# Patient Record
Sex: Male | Born: 1990 | Race: Black or African American | Hispanic: No | Marital: Single | Smoking: Current every day smoker
Health system: Southern US, Community
[De-identification: ages and names within clinical notes are randomized; demographics above are authoritative.]

---
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
## 2012-07-02 ENCOUNTER — Emergency Department (HOSPITAL_BASED_OUTPATIENT_CLINIC_OR_DEPARTMENT_OTHER)
Admission: EM | Admit: 2012-07-02 | Discharge: 2012-07-03 | Disposition: A | Discharge status: Home / Self Care | Payer: Self-pay | Attending: Emergency Medicine | Admitting: Emergency Medicine

## 2012-07-02 ENCOUNTER — Encounter (HOSPITAL_BASED_OUTPATIENT_CLINIC_OR_DEPARTMENT_OTHER): Payer: Self-pay | Admitting: *Deleted

## 2012-07-02 DIAGNOSIS — R51 Headache: Secondary | ICD-10-CM | POA: Insufficient documentation

## 2012-07-02 DIAGNOSIS — F17200 Nicotine dependence, unspecified, uncomplicated: Secondary | ICD-10-CM | POA: Insufficient documentation

## 2012-07-02 DIAGNOSIS — B349 Viral infection, unspecified: Secondary | ICD-10-CM

## 2012-07-02 DIAGNOSIS — R059 Cough, unspecified: Secondary | ICD-10-CM | POA: Insufficient documentation

## 2012-07-02 DIAGNOSIS — R197 Diarrhea, unspecified: Secondary | ICD-10-CM | POA: Insufficient documentation

## 2012-07-02 DIAGNOSIS — R1110 Vomiting, unspecified: Secondary | ICD-10-CM | POA: Insufficient documentation

## 2012-07-02 DIAGNOSIS — R6883 Chills (without fever): Secondary | ICD-10-CM | POA: Insufficient documentation

## 2012-07-02 DIAGNOSIS — R05 Cough: Secondary | ICD-10-CM | POA: Insufficient documentation

## 2012-07-02 DIAGNOSIS — B9789 Other viral agents as the cause of diseases classified elsewhere: Secondary | ICD-10-CM | POA: Insufficient documentation

## 2012-07-02 LAB — URINALYSIS, ROUTINE W REFLEX MICROSCOPIC
Glucose, UA: NEGATIVE mg/dL
Ketones, ur: 15 mg/dL — AB
Leukocytes, UA: NEGATIVE
Nitrite: NEGATIVE
Specific Gravity, Urine: 1.042 — ABNORMAL HIGH (ref 1.005–1.030)
pH: 6 (ref 5.0–8.0)

## 2012-07-02 LAB — URINE MICROSCOPIC-ADD ON

## 2012-07-02 LAB — RAPID STREP SCREEN (MED CTR MEBANE ONLY): Streptococcus, Group A Screen (Direct): NEGATIVE

## 2012-07-02 NOTE — ED Notes (Signed)
Headache, vomiting, cough, chills, and sore throat since yesterday.

## 2012-07-03 MED ORDER — DEXAMETHASONE SODIUM PHOSPHATE 10 MG/ML IJ SOLN
10.0000 mg | Freq: Once | INTRAMUSCULAR | Status: AC
  Administered 2012-07-03: 10 mg via INTRAVENOUS
  Filled 2012-07-03: qty 1

## 2012-07-03 MED ORDER — ONDANSETRON HCL 8 MG PO TABS: 8.0000 mg | ORAL_TABLET | Freq: Three times a day (TID) | ORAL | Status: DC | PRN

## 2012-07-03 MED ORDER — SODIUM CHLORIDE 0.9 % IV BOLUS (SEPSIS)
1000.0000 mL | Freq: Once | INTRAVENOUS | Status: AC
  Administered 2012-07-03: 1000 mL via INTRAVENOUS

## 2012-07-03 MED ORDER — HYDROCOD POLST-CHLORPHEN POLST 10-8 MG/5ML PO LQCR: 5.0000 mL | Freq: Two times a day (BID) | ORAL | Status: DC | PRN

## 2012-07-03 MED ORDER — ONDANSETRON HCL 4 MG/2ML IJ SOLN
4.0000 mg | Freq: Once | INTRAMUSCULAR | Status: AC
  Administered 2012-07-03: 4 mg via INTRAVENOUS
  Filled 2012-07-03: qty 2

## 2012-07-03 NOTE — ED Provider Notes (Signed)
History  This chart was scribed for Ronald Seamen, MD by Shari Heritage, ED Scribe. The patient was seen in room MH01/MH01. Patient's care was started at 0012.   CSN: [id number]  Arrival date & time 07/02/12  2323   First MD Initiated Contact with Patient 07/03/12 0012      Chief Complaint  Patient presents with  . Sore Throat      The history is provided by the patient. No language interpreter was used.     HPI Comments: Ronald Sanders is a 22 y.o. male who presents to the Emergency Department constant, moderate to severe sore throat pain onset 1.5 days ago. Patient is also complaining of a moderate to severe, constant, right-sided headache. There is associated 1 episode of vomiting, diarrhea, chills and dry mouth. Patient denies cough. He reports no other symptoms or complaints at this time. Patient reports no significant past medical history.  History reviewed. No pertinent past medical history.  History reviewed. No pertinent past surgical history.  No family history on file.  History  Substance Use Topics  . Smoking status: Current Every Day Smoker -- 0.5 packs/day    Types: Cigarettes  . Smokeless tobacco: Not on file  . Alcohol Use: No      Review of Systems A complete 10 system review of systems was obtained and all systems are negative except as noted in the HPI and PMH.   Allergies  Review of patient's allergies indicates no known allergies.  Home Medications  No current outpatient prescriptions on file.  BP 139/77  Pulse 96  Temp 98.5 F (36.9 C) (Oral)  Resp 20  Ht 5\' 9"  (1.753 m)  Wt 170 lb (77.111 kg)  BMI 25.10 kg/m2  SpO2 99%  Physical Exam  Constitutional: He is oriented to person, place, and time. He appears well-developed and well-nourished. No distress.  HENT:  Head: Normocephalic and atraumatic.  Mouth/Throat: Mucous membranes are normal. Posterior oropharyngeal edema and posterior oropharyngeal erythema present. No oropharyngeal  exudate.       Oropharyngeal erythema with mild edema. No exudate.  Midl anterior cervical adenopathy.   Eyes: Conjunctivae normal are normal. Pupils are equal, round, and reactive to light.  Neck: Normal range of motion. Neck supple.       Mild anterior cervical adenopathy.  Cardiovascular: Normal rate, regular rhythm and normal heart sounds.   Pulmonary/Chest: Effort normal and breath sounds normal. No respiratory distress. He has no wheezes. He has no rales.  Musculoskeletal: Normal range of motion.  Lymphadenopathy:    He has cervical adenopathy.  Neurological: He is alert and oriented to person, place, and time.  Skin: Skin is warm and dry. No rash noted.  Psychiatric: He has a normal mood and affect. His behavior is normal.    ED Course  Procedures (including critical care time) DIAGNOSTIC STUDIES: Oxygen Saturation is 99% on room air, normal by my interpretation.    COORDINATION OF CARE: 12:18 AM- Patient informed of current plan for treatment and evaluation and agrees with plan at this time.     MDM   Nursing notes and vitals signs, including pulse oximetry, reviewed.  Summary of this visit's results, reviewed by myself:  Labs:  Results for orders placed during the hospital encounter of 07/02/12 (from the past 24 hour(s))  RAPID STREP SCREEN     Status: Normal   Collection Time   07/02/12 11:29 PM      Component Value Range   Streptococcus, Group A  Screen (Direct) NEGATIVE  NEGATIVE  URINALYSIS, ROUTINE W REFLEX MICROSCOPIC     Status: Abnormal   Collection Time   07/02/12 11:30 PM      Component Value Range   Color, Urine AMBER (*) YELLOW   APPearance CLEAR  CLEAR   Specific Gravity, Urine 1.042 (*) 1.005 - 1.030   pH 6.0  5.0 - 8.0   Glucose, UA NEGATIVE  NEGATIVE mg/dL   Hgb urine dipstick NEGATIVE  NEGATIVE   Bilirubin Urine SMALL (*) NEGATIVE   Ketones, ur 15 (*) NEGATIVE mg/dL   Protein, ur 30 (*) NEGATIVE mg/dL   Urobilinogen, UA 1.0  0.0 - 1.0 mg/dL    Nitrite NEGATIVE  NEGATIVE   Leukocytes, UA NEGATIVE  NEGATIVE  URINE MICROSCOPIC-ADD ON     Status: Normal   Collection Time   07/02/12 11:30 PM      Component Value Range   Squamous Epithelial / LPF RARE  RARE   WBC, UA 0-2  <3 WBC/hpf   RBC / HPF 0-2  <3 RBC/hpf   Bacteria, UA RARE  RARE   Urine-Other MUCOUS PRESENT     1:22 AM Patient taking fluids without emesis after IV fluid bolus and Zofran IV.      I personally performed the services described in this documentation, which was scribed in my presence.  The recorded information has been reviewed and is accurate.Carlisle Beers Marcia Hartwell, MD 07/03/12 [phone]

## 2013-12-18 ENCOUNTER — Emergency Department (HOSPITAL_COMMUNITY)
Admission: EM | Admit: 2013-12-18 | Discharge: 2013-12-19 | Disposition: A | Discharge status: Home / Self Care | Payer: Self-pay | Attending: Emergency Medicine | Admitting: Emergency Medicine

## 2013-12-18 ENCOUNTER — Encounter (HOSPITAL_COMMUNITY): Payer: Self-pay | Admitting: Emergency Medicine

## 2013-12-18 ENCOUNTER — Emergency Department (HOSPITAL_COMMUNITY): Payer: Self-pay

## 2013-12-18 DIAGNOSIS — T1490XA Injury, unspecified, initial encounter: Secondary | ICD-10-CM

## 2013-12-18 DIAGNOSIS — S99919A Unspecified injury of unspecified ankle, initial encounter: Secondary | ICD-10-CM

## 2013-12-18 DIAGNOSIS — Y92410 Unspecified street and highway as the place of occurrence of the external cause: Secondary | ICD-10-CM | POA: Insufficient documentation

## 2013-12-18 DIAGNOSIS — S6990XA Unspecified injury of unspecified wrist, hand and finger(s), initial encounter: Secondary | ICD-10-CM | POA: Insufficient documentation

## 2013-12-18 DIAGNOSIS — S46909A Unspecified injury of unspecified muscle, fascia and tendon at shoulder and upper arm level, unspecified arm, initial encounter: Secondary | ICD-10-CM | POA: Insufficient documentation

## 2013-12-18 DIAGNOSIS — S8990XA Unspecified injury of unspecified lower leg, initial encounter: Secondary | ICD-10-CM | POA: Insufficient documentation

## 2013-12-18 DIAGNOSIS — S99929A Unspecified injury of unspecified foot, initial encounter: Secondary | ICD-10-CM

## 2013-12-18 DIAGNOSIS — Y9389 Activity, other specified: Secondary | ICD-10-CM | POA: Insufficient documentation

## 2013-12-18 DIAGNOSIS — F17200 Nicotine dependence, unspecified, uncomplicated: Secondary | ICD-10-CM | POA: Insufficient documentation

## 2013-12-18 DIAGNOSIS — IMO0002 Reserved for concepts with insufficient information to code with codable children: Secondary | ICD-10-CM | POA: Insufficient documentation

## 2013-12-18 DIAGNOSIS — S4980XA Other specified injuries of shoulder and upper arm, unspecified arm, initial encounter: Secondary | ICD-10-CM | POA: Insufficient documentation

## 2013-12-18 MED ORDER — HYDROCODONE-ACETAMINOPHEN 5-325 MG PO TABS
1.0000 | ORAL_TABLET | Freq: Once | ORAL | Status: AC
  Administered 2013-12-18: 1 via ORAL
  Filled 2013-12-18: qty 1

## 2013-12-18 NOTE — ED Notes (Signed)
Patient transported to X-ray 
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
## 2021-07-16 ENCOUNTER — Emergency Department (HOSPITAL_COMMUNITY)
Admission: EM | Admit: 2021-07-16 | Discharge: 2021-07-16 | Disposition: A | Discharge status: Home / Self Care | Payer: Self-pay | Attending: Emergency Medicine | Admitting: Emergency Medicine

## 2021-07-16 ENCOUNTER — Encounter (HOSPITAL_COMMUNITY): Payer: Self-pay | Admitting: *Deleted

## 2021-07-16 DIAGNOSIS — H70012 Subperiosteal abscess of mastoid, left ear: Secondary | ICD-10-CM | POA: Diagnosis not present

## 2021-07-16 DIAGNOSIS — H938X2 Other specified disorders of left ear: Secondary | ICD-10-CM | POA: Diagnosis present

## 2021-07-16 DIAGNOSIS — Z79899 Other long term (current) drug therapy: Secondary | ICD-10-CM | POA: Insufficient documentation

## 2021-07-16 DIAGNOSIS — L0291 Cutaneous abscess, unspecified: Secondary | ICD-10-CM

## 2021-07-16 MED ORDER — IBUPROFEN 800 MG PO TABS
800.0000 mg | ORAL_TABLET | Freq: Once | ORAL | Status: AC
  Administered 2021-07-16: 800 mg via ORAL
  Filled 2021-07-16: qty 1

## 2021-07-16 MED ORDER — LIDOCAINE HCL (PF) 1 % IJ SOLN
5.0000 mL | Freq: Once | INTRAMUSCULAR | Status: AC
Start: 2021-07-16 — End: 2021-07-16
  Administered 2021-07-16: 5 mL
  Filled 2021-07-16: qty 30

## 2021-07-16 MED ORDER — POVIDONE-IODINE 10 % EX SOLN
CUTANEOUS | Status: AC
  Filled 2021-07-16: qty 14.8

## 2021-07-16 MED ORDER — DOXYCYCLINE HYCLATE 100 MG PO CAPS: 100.0000 mg | ORAL_CAPSULE | Freq: Two times a day (BID) | ORAL | 0 refills | Status: AC

## 2021-07-16 NOTE — Discharge Instructions (Signed)
You have a small abscess that is draining, recommend applying warm compresses to the area to 3 times daily for next 5 days, starting on antibiotics please take as prescribed.  Please come back to emergency department if symptoms worsen i.e. worsening pain swelling fevers or chills as you may need additional drainage.

## 2021-07-16 NOTE — ED Provider Notes (Signed)
[place] Provider Note   CSN: [id number] Arrival date & time: 07/16/21  1152     History  Chief Complaint  Patient presents with   Otalgia    Ronald Sanders is a 31 y.o. male.  HPI  Patient out significant medical history presents with complaints of mass in his left ear.  Patient  states he noted a few days ago, came on suddenly, states its he has constant pain in that area, describes as a throbbing-like sensation, denies any drainage or discharge but does note that he has slightly decreased in hearing, denies ever having this past, denies sticking anything in his ear, he has no other symptoms no lightheadedness dizziness congestion cough general body aches.  He is up-to-date on his tetanus shot, not immunocompromise.  Home Medications Prior to Admission medications   Medication Sig Start Date End Date Taking? Authorizing Provider  doxycycline (VIBRAMYCIN) 100 MG capsule Take 1 capsule (100 mg total) by mouth 2 (two) times daily for 7 days. 07/16/21 07/23/21 Yes Marcello Fennel, PA-C  ALPRAZolam Duanne Moron) 0.5 MG tablet Take 0.5 mg by mouth 3 (three) times daily as needed for anxiety.    [provider]  HYDROcodone-acetaminophen (NORCO/VICODIN) 5-325 MG per tablet Take 1 tablet by mouth every 6 (six) hours as needed for moderate pain.    [provider]      Allergies    Patient has no known allergies.    Review of Systems   Review of Systems  Constitutional:  Negative for chills and fever.  HENT:  Positive for ear pain. Negative for congestion, drooling and ear discharge.   Respiratory:  Negative for shortness of breath.   Cardiovascular:  Negative for chest pain.  Gastrointestinal:  Negative for abdominal pain.  Neurological:  Negative for headaches.   Physical Exam Updated Vital Signs BP (!) 133/91    Pulse 64    Temp 98.2 F (36.8 C) (Oral)    Resp 18    SpO2 100%  Physical Exam Vitals and nursing note reviewed.   Constitutional:      General: He is not in acute distress.    Appearance: He is not ill-appearing.  HENT:     Head: Normocephalic and atraumatic.     Right Ear: Tympanic membrane, ear canal and external ear normal. There is no impacted cerumen.     Left Ear: Tympanic membrane, ear canal and external ear normal. There is no impacted cerumen.     Ears:     Comments: No ear protrusion, no tenderness behind the mastoids, patient has a noted pustule on the 12 o'clock position of the outer left ear canal, tender to palpation fluctuance noted no induration, measured about the size of a jellybean.    Nose: No congestion.  Eyes:     Conjunctiva/sclera: Conjunctivae normal.  Cardiovascular:     Rate and Rhythm: Normal rate and regular rhythm.  Pulmonary:     Effort: Pulmonary effort is normal.     Breath sounds: Normal breath sounds.  Skin:    General: Skin is warm and dry.  Neurological:     Mental Status: He is alert.  Psychiatric:        Mood and Affect: Mood normal.    ED Results / Procedures / Treatments   Labs (all labs ordered are listed, but only abnormal results are displayed) Labs Reviewed - No data to display  EKG None  Radiology No results found.  Procedures .Marland KitchenIncision and Drainage  Date/Time: 07/16/2021 2:18 PM Performed by: Marcello Fennel, PA-C Authorized by: Marcello Fennel, PA-C   Consent:    Consent obtained:  Verbal   Consent given by:  Patient   Risks discussed:  Bleeding, incomplete drainage, pain, damage to other organs and infection   Alternatives discussed:  No treatment, delayed treatment, alternative treatment, observation and referral Universal protocol:    Patient identity confirmed:  Verbally with patient Location:    Type:  Abscess   Size:  96mm   Location: 12 clock position of the outer aspect of the ear canal. Pre-procedure details:    Skin preparation:  Povidone-iodine Sedation:    Sedation type:  None Anesthesia:    Anesthesia  method:  Local infiltration   Local anesthetic:  Lidocaine 1% w/o epi Procedure type:    Complexity:  Simple Procedure details:    Ultrasound guidance: no     Needle aspiration: no     Incision types:  Stab incision   Incision depth:  Subcutaneous   Wound management:  Probed and deloculated   Drainage:  Bloody and purulent   Drainage amount:  Moderate   Wound treatment:  Wound left open   Packing materials:  None Post-procedure details:    Procedure completion:  Tolerated well, no immediate complications    Medications Ordered in ED Medications  lidocaine (PF) (XYLOCAINE) 1 % injection 5 mL (has no administration in time range)  povidone-iodine (BETADINE) 10 % external solution (has no administration in time range)  ibuprofen (ADVIL) tablet 800 mg (has no administration in time range)    ED Course/ Medical Decision Making/ A&P                           Medical Decision Making Risk Prescription drug management.   This patient presents to the ED for concern of mass in left ear, this involves an extensive number of treatment options, and is a complaint that carries with it a high risk of complications and morbidity.  The differential diagnosis includes mastoiditis, otitis external, otitis media    Additional history obtained:  Additional history obtained from N/A    Co morbidities that complicate the patient evaluation  N/A  Social Determinants of Health:  Prisoner    Lab Tests:  I Ordered, and personally interpreted labs.  The pertinent results include: N/A   Imaging Studies ordered:  I ordered imaging studies including N/A    Reevaluation:  Patient with skin abscess  located left outer ear, amenable to incision and drainage.  Patient tolerated procedure well.  Abscess was not large enough to warrant packing or drain   Rule out Low suspicion for mastoiditis as there is no ear protrusion, no tenderness upon the mastoids.  Of the suspicion for otitis  media or externa no signs of infection along the TMs nonbulging nonerythematous, no signs of infection seen within the ear canal itself.    Dispostion and problem list  After consideration of the diagnostic results and the patients response to treatment, I feel that the patent would benefit from discharge.  Abscess-patient an abscess which was drained, will start him on antibiotics as there some slight erythema present my exam, given strict return precautions.  Come back in 48-72 hours if symptoms are worsening or do not improve.            Final Clinical Impression(s) / ED Diagnoses Final diagnoses:  Abscess    Rx / DC Orders ED  Discharge Orders          Ordered    doxycycline (VIBRAMYCIN) 100 MG capsule  2 times daily        07/16/21 1424              Aron Baba 07/16/21 1425    Luna Fuse, MD 07/25/21 1258

## 2021-07-16 NOTE — ED Triage Notes (Signed)
SWOLLEN AREA INSIDE LEFT EAR X 5 DAYS

## 2022-09-27 ENCOUNTER — Other Ambulatory Visit: Payer: Self-pay
